# Patient Record
Sex: Male | Born: 1995 | Race: Black or African American | Hispanic: No | Marital: Single | Smoking: Never smoker
Health system: Southern US, Community
[De-identification: ages and names within clinical notes are randomized; demographics above are authoritative.]

## PROBLEM LIST (undated history)

## (undated) DIAGNOSIS — R51 Headache: Secondary | ICD-10-CM

## (undated) DIAGNOSIS — G259 Extrapyramidal and movement disorder, unspecified: Secondary | ICD-10-CM

## (undated) DIAGNOSIS — I440 Atrioventricular block, first degree: Secondary | ICD-10-CM

## (undated) DIAGNOSIS — S62609A Fracture of unspecified phalanx of unspecified finger, initial encounter for closed fracture: Secondary | ICD-10-CM

## (undated) DIAGNOSIS — S42309A Unspecified fracture of shaft of humerus, unspecified arm, initial encounter for closed fracture: Secondary | ICD-10-CM

## (undated) HISTORY — DX: Extrapyramidal and movement disorder, unspecified: G25.9

## (undated) HISTORY — DX: Headache: R51

## (undated) HISTORY — PX: ADENOIDECTOMY: SUR15

## (undated) HISTORY — PX: CIRCUMCISION: SUR203

---
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
## 2017-10-24 ENCOUNTER — Other Ambulatory Visit: Payer: Self-pay | Admitting: Family Medicine

## 2017-10-24 DIAGNOSIS — J302 Other seasonal allergic rhinitis: Secondary | ICD-10-CM

## 2017-11-23 ENCOUNTER — Encounter (INDEPENDENT_AMBULATORY_CARE_PROVIDER_SITE_OTHER): Payer: Self-pay | Admitting: Orthopaedic Surgery

## 2017-11-23 ENCOUNTER — Ambulatory Visit (INDEPENDENT_AMBULATORY_CARE_PROVIDER_SITE_OTHER): Payer: Medicaid Other | Admitting: Orthopaedic Surgery

## 2017-11-23 DIAGNOSIS — S62620A Displaced fracture of medial phalanx of right index finger, initial encounter for closed fracture: Secondary | ICD-10-CM

## 2017-11-23 MED ORDER — HYDROCODONE-ACETAMINOPHEN 5-325 MG PO TABS
1.0000 | ORAL_TABLET | Freq: Every day | ORAL | 0 refills | Status: AC | PRN
Start: 2017-11-23 — End: ?

## 2017-11-23 NOTE — Progress Notes (Signed)
Office Visit Note   Patient: Christopher Taylor           Date of Birth: [birth year]           MRN: [id number] Visit Date: 11/23/2017              Requested by: Marthenia Rolling, DO [address] [place], [place] [address] PCP: Marthenia Rolling, DO   Assessment & Plan: Visit Diagnoses:  1. Closed displaced fracture of middle phalanx of right index finger, initial encounter     Plan: Impression is 22 year old gentleman with right index finger fracture and abrasion.  Recommend Bactrim ointment twice a day and buddy taping to the middle finger for joint mobilization.  Questions encouraged and answered.  Follow-up in 3 weeks with 2 view x-rays of the right index finger.  Follow-Up Instructions: Return in about 3 weeks (around 12/14/2017).   Orders:  No orders of the defined types were placed in this encounter.  Meds ordered this encounter  Medications  . HYDROcodone-acetaminophen (NORCO) 5-325 MG tablet    Sig: Take 1-2 tablets by mouth daily as needed.    Dispense:  10 tablet    Refill:  0      Procedures: No procedures performed   Clinical Data: No additional findings.   Subjective: Chief Complaint  Patient presents with  . Right Index Finger - Injury    DOI 11/11/17 - fell while running bases in a softball game    Christopher Taylor is a healthy 22 year old who sustained a right index finger injury about 11 days ago while playing softball.  He fell directly onto his middle finger and sustained a dorsal abrasion to the finger.  He also has a minimally displaced middle phalanx fracture near the base.  Denies any numbness and tingling.  He is taking ibuprofen and Tylenol for pain.  Is currently in college.   Review of Systems  Constitutional: Negative.   All other systems reviewed and are negative.    Objective: Vital Signs: There were no vitals taken for this visit.  Physical Exam  Constitutional: He is oriented to person, place, and time. He appears well-developed and  well-nourished.  HENT:  Head: Normocephalic and atraumatic.  Eyes: Pupils are equal, round, and reactive to light.  Neck: Neck supple.  Pulmonary/Chest: Effort normal.  Abdominal: Soft.  Musculoskeletal: Normal range of motion.  Neurological: He is alert and oriented to person, place, and time.  Skin: Skin is warm.  Psychiatric: He has a normal mood and affect. His behavior is normal. Judgment and thought content normal.  Nursing note and vitals reviewed.   Ortho Exam Right index finger exam shows moderate swelling.  No neurovascular compromise.  Flexor extensor function intact.  Collateral ligaments are intact.  Mild tenderness to palpation at the PIP joint on the ulnar side.  Superficial abrasion over the distal portion of the finger not overlying the fracture.  No evidence of infection. Specialty Comments:  No specialty comments available.  Imaging: No results found.   PMFS History: Patient Active Problem List   Diagnosis Date Noted  . Allergic conjunctivitis of both eyes 11/28/2016  . Heart murmur, systolic 07/04/2016  . History of placement of ear tubes 07/04/2016  . Electronic cigarette use 07/04/2016  . Allergic rhinitis 01/09/2015  . Elevated BP 01/04/2014  . Elevated TSH 03/08/2013  . Eczema 04/08/2012  . CHILDHOOD OBESITY 02/22/2009   Past Medical History:  Diagnosis Date  . AV block, 1st degree    "AV  conduction issue" per mom, sees [facility]  . Broken arm    Left  . Broken finger    L middle finger  . Headache(784.0)   . Movement disorder     Family History  Problem Relation Age of Onset  . Diabetes Maternal Grandmother   . Hypertension Maternal Grandmother   . Stroke Maternal Grandmother        when she was in her 29s, no complications from this  . Hypertension Maternal Grandfather   . Cancer Paternal Grandfather     Past Surgical History:  Procedure Laterality Date  . ADENOIDECTOMY    . CIRCUMCISION  1999  . TYMPANOSTOMY TUBE PLACEMENT   Feb. 2014   Mom states pt has had these placed 8 times in past   Social History   Occupational History  . Not on file  Tobacco Use  . Smoking status: Never Smoker  . Smokeless tobacco: Never Used  Substance and Sexual Activity  . Alcohol use: No  . Drug use: No  . Sexual activity: Never    Partners: Female

## 2017-12-15 ENCOUNTER — Ambulatory Visit (INDEPENDENT_AMBULATORY_CARE_PROVIDER_SITE_OTHER): Payer: Medicaid Other | Admitting: Orthopaedic Surgery

## 2017-12-15 ENCOUNTER — Encounter (INDEPENDENT_AMBULATORY_CARE_PROVIDER_SITE_OTHER): Payer: Self-pay | Admitting: Orthopaedic Surgery

## 2017-12-15 ENCOUNTER — Ambulatory Visit (INDEPENDENT_AMBULATORY_CARE_PROVIDER_SITE_OTHER): Payer: Medicaid Other

## 2017-12-15 DIAGNOSIS — S62620A Displaced fracture of medial phalanx of right index finger, initial encounter for closed fracture: Secondary | ICD-10-CM | POA: Diagnosis not present

## 2017-12-15 NOTE — Progress Notes (Signed)
   Post-Op Visit Note   Patient: Christopher Taylor           Date of Birth: 09/26/1995           MRN: [id number] Visit Date: 12/15/2017 PCP: Marthenia Rolling, DO   Assessment & Plan:  Chief Complaint:  Chief Complaint  Patient presents with  . Rt index finger    follow up   Visit Diagnoses:  1. Closed displaced fracture of middle phalanx of right index finger, initial encounter     Plan: Patient is 4 weeks status post nondisplaced middle phalanx fracture of the index finger.  He is overall doing better.  He still has some stiffness.  The abrasion is healed.  At this point we will refer him to hand therapy for strengthening range of motion.  May discontinue.  Follow-up in 6 weeks with 2 view x-rays of his finger.  Follow-Up Instructions: Return in about 6 weeks (around 01/26/2018).   Orders:  Orders Placed This Encounter  Procedures  . XR Finger Index Right   No orders of the defined types were placed in this encounter.   Imaging: Xr Finger Index Right  Result Date: 12/15/2017 Stable alignment of middle phalanx fracture with evidence of bridging bony callus formation   PMFS History: Patient Active Problem List   Diagnosis Date Noted  . Allergic conjunctivitis of both eyes 11/28/2016  . Heart murmur, systolic 07/04/2016  . History of placement of ear tubes 07/04/2016  . Electronic cigarette use 07/04/2016  . Allergic rhinitis 01/09/2015  . Elevated BP 01/04/2014  . Elevated TSH 03/08/2013  . Eczema 04/08/2012  . CHILDHOOD OBESITY 02/22/2009   Past Medical History:  Diagnosis Date  . AV block, 1st degree    "AV conduction issue" per mom, sees [facility]  . Broken arm    Left  . Broken finger    L middle finger  . Headache(784.0)   . Movement disorder     Family History  Problem Relation Age of Onset  . Diabetes Maternal Grandmother   . Hypertension Maternal Grandmother   . Stroke Maternal Grandmother        when she was in her 37s, no complications from  this  . Hypertension Maternal Grandfather   . Cancer Paternal Grandfather     Past Surgical History:  Procedure Laterality Date  . ADENOIDECTOMY    . CIRCUMCISION  1999  . TYMPANOSTOMY TUBE PLACEMENT  Feb. 2014   Mom states pt has had these placed 8 times in past   Social History   Occupational History  . Not on file  Tobacco Use  . Smoking status: Never Smoker  . Smokeless tobacco: Never Used  Substance and Sexual Activity  . Alcohol use: No  . Drug use: No  . Sexual activity: Never    Partners: Female

## 2018-01-26 ENCOUNTER — Ambulatory Visit (INDEPENDENT_AMBULATORY_CARE_PROVIDER_SITE_OTHER): Payer: Medicaid Other

## 2018-01-26 ENCOUNTER — Ambulatory Visit (INDEPENDENT_AMBULATORY_CARE_PROVIDER_SITE_OTHER): Payer: Medicaid Other | Admitting: Orthopaedic Surgery

## 2018-01-26 ENCOUNTER — Encounter (INDEPENDENT_AMBULATORY_CARE_PROVIDER_SITE_OTHER): Payer: Self-pay | Admitting: Orthopaedic Surgery

## 2018-01-26 DIAGNOSIS — S62620A Displaced fracture of medial phalanx of right index finger, initial encounter for closed fracture: Secondary | ICD-10-CM

## 2018-01-26 NOTE — Progress Notes (Signed)
Office Visit Note   Patient: Christopher Taylor           Date of Birth: 03-16-1996           MRN: [id number] Visit Date: 01/26/2018              Requested by: Marthenia Rolling, DO [address] [place], [place] [address] PCP: Marthenia Rolling, DO   Assessment & Plan: Visit Diagnoses:  1. Closed displaced fracture of middle phalanx of right index finger, initial encounter     Plan: Patient has demonstrated clinical healing of the fracture.  His range of motion and function of the hand are coming along well.  At this point he may continue to increase activity as tolerated and use of the hand as tolerated.  Questions encouraged and answered.  Follow-up as needed.  Follow-Up Instructions: Return if symptoms worsen or fail to improve.   Orders:  Orders Placed This Encounter  Procedures  . XR Finger Index Right   No orders of the defined types were placed in this encounter.     Procedures: No procedures performed   Clinical Data: No additional findings.   Subjective: Chief Complaint  Patient presents with  . Right Index Finger - Pain    [name] follows up today approximately 2 months status post minimally displaced middle phalanx fracture of the right index finger.  He is doing well.  Denies any pain.  He does have some residual stiffness of the PIP joint.  Overall he is doing well.   Review of Systems   Objective: Vital Signs: There were no vitals taken for this visit.  Physical Exam  Ortho Exam His wound has fully healed.  No neurovascular compromise.  He is almost able to make a full composite fist.  Collateral ligaments are intact.  Mild swelling of the PIP joint.  No tenderness palpation. Specialty Comments:  No specialty comments available.  Imaging: Xr Finger Index Right  Result Date: 01/26/2018 Stable appearance of phalangeal fracture.  Slight improvement in callus formation.    PMFS History: Patient Active Problem List   Diagnosis Date Noted  .  Allergic conjunctivitis of both eyes 11/28/2016  . Heart murmur, systolic 07/04/2016  . History of placement of ear tubes 07/04/2016  . Electronic cigarette use 07/04/2016  . Allergic rhinitis 01/09/2015  . Elevated BP 01/04/2014  . Elevated TSH 03/08/2013  . Eczema 04/08/2012  . CHILDHOOD OBESITY 02/22/2009   Past Medical History:  Diagnosis Date  . AV block, 1st degree    "AV conduction issue" per mom, sees [facility]  . Broken arm    Left  . Broken finger    L middle finger  . Headache(784.0)   . Movement disorder     Family History  Problem Relation Age of Onset  . Diabetes Maternal Grandmother   . Hypertension Maternal Grandmother   . Stroke Maternal Grandmother        when she was in her 29s, no complications from this  . Hypertension Maternal Grandfather   . Cancer Paternal Grandfather     Past Surgical History:  Procedure Laterality Date  . ADENOIDECTOMY    . CIRCUMCISION  1999  . TYMPANOSTOMY TUBE PLACEMENT  Feb. 2014   Mom states pt has had these placed 8 times in past   Social History   Occupational History  . Not on file  Tobacco Use  . Smoking status: Never Smoker  . Smokeless tobacco: Never Used  Substance and  Sexual Activity  . Alcohol use: No  . Drug use: No  . Sexual activity: Never    Partners: Female

## 2019-10-18 ENCOUNTER — Ambulatory Visit: Payer: Self-pay | Admitting: Family Medicine

## 2021-12-24 ENCOUNTER — Encounter: Payer: Self-pay | Admitting: *Deleted
